# Patient Record
Sex: Male | Born: 1970 | Race: White | Hispanic: No | Marital: Single | Smoking: Never smoker
Health system: Southern US, Community
[De-identification: ages and names within clinical notes are randomized; demographics above are authoritative.]

## PROBLEM LIST (undated history)

## (undated) DIAGNOSIS — G40909 Epilepsy, unspecified, not intractable, without status epilepticus: Secondary | ICD-10-CM

## (undated) DIAGNOSIS — R42 Dizziness and giddiness: Secondary | ICD-10-CM

## (undated) HISTORY — PX: HERNIA REPAIR: SHX51

---

## 2013-07-19 DIAGNOSIS — R42 Dizziness and giddiness: Secondary | ICD-10-CM

## 2013-07-19 HISTORY — DX: Dizziness and giddiness: R42

## 2015-09-15 ENCOUNTER — Emergency Department
Admission: EM | Admit: 2015-09-15 | Discharge: 2015-09-15 | Disposition: A | Discharge status: Home / Self Care | Payer: BLUE CROSS/BLUE SHIELD | Attending: Emergency Medicine | Admitting: Emergency Medicine

## 2015-09-15 ENCOUNTER — Encounter: Payer: Self-pay | Admitting: Emergency Medicine

## 2015-09-15 DIAGNOSIS — T380X5A Adverse effect of glucocorticoids and synthetic analogues, initial encounter: Secondary | ICD-10-CM | POA: Insufficient documentation

## 2015-09-15 DIAGNOSIS — T783XXA Angioneurotic edema, initial encounter: Secondary | ICD-10-CM | POA: Diagnosis not present

## 2015-09-15 DIAGNOSIS — R220 Localized swelling, mass and lump, head: Secondary | ICD-10-CM | POA: Diagnosis present

## 2015-09-15 HISTORY — DX: Epilepsy, unspecified, not intractable, without status epilepticus: G40.909

## 2015-09-15 MED ORDER — FAMOTIDINE IN NACL 20-0.9 MG/50ML-% IV SOLN
20.0000 mg | Freq: Once | INTRAVENOUS | Status: AC
  Administered 2015-09-15: 20 mg via INTRAVENOUS
  Filled 2015-09-15: qty 50

## 2015-09-15 MED ORDER — SODIUM CHLORIDE 0.9 % IV BOLUS (SEPSIS)
1000.0000 mL | Freq: Once | INTRAVENOUS | Status: AC
  Administered 2015-09-15: 1000 mL via INTRAVENOUS

## 2015-09-15 MED ORDER — DIPHENHYDRAMINE HCL 50 MG/ML IJ SOLN
25.0000 mg | Freq: Once | INTRAMUSCULAR | Status: AC
  Administered 2015-09-15: 25 mg via INTRAVENOUS
  Filled 2015-09-15: qty 1

## 2015-09-15 NOTE — ED Provider Notes (Signed)
Patient reassessed at 9:15 AM, found to have no change in symptoms. Floor of mouth soft without any swelling or palatal elevation. Handling secretions, no difficulty swallowing, drinking fluids. Lungs clear to auscultation bilaterally with normal work of breathing. Swelling isolated to the lower lip only without any intraoral or throat involvement.  ----------------------------------------- 10:46 AM on 09/15/2015 ----------------------------------------- Patient stable, calm and comfortable. Lip swelling has diminished slightly. Otherwise no changes. Appears to be improving. With his subacute angioedema, likely related to prednisone use which is since been discontinued, I think this is going to continue improving. No evidence of any airway compromise at this time, we'll discharge home to follow up with primary care.   Sharman Cheek, MD 09/15/15 1047

## 2015-09-15 NOTE — ED Notes (Addendum)
Pt c/o oral swelling on bottom lip from taking prednisone Tuesday for URI. Pt has obvious swelling to bottom lip. Denies any itching or SOB from angioedema. Speech clear.PtA&O. VS stable

## 2015-09-15 NOTE — ED Notes (Signed)
Correction, pt states lip swelling occurred after prednisone was prescribed, pt was prescribed prednisone for URI symptoms.

## 2015-09-15 NOTE — ED Provider Notes (Signed)
[facility] Emergency Department Provider Note  ____________________________________________  Time seen: Approximately 623 AM  I have reviewed the triage vital signs and the nursing notes.   HISTORY  Chief Complaint Oral Swelling    HPI Jeremy Brooks is a 45 y.o. male who comes into the hospital today with lower lip swelling which started on Friday or Saturday. The patient reports that he has been taking prednisone since Tuesday. The patient reports that he completed his dose pack yesterday. He reports that the swelling started before he completed the pack but he was unsure what was going on. The patient reports that he took some Tylenol allergy and sinus and some naproxen but it did not help. The patient reports he has never had this happen before. He does not take any blood pressure medicines and has not been on any antibiotics. He was told it is upper respiratory infection for which she was taken the prednisone was a viral. The patient reports that he's had some shortness of breath but that was related to the upper respiratory infection. He's had no throat scratching or throat swelling. The patient was concerned tonight so he decided to come in and get checked out.   Past Medical History  Diagnosis Date  . Epilepsy (HCC)     There are no active problems to display for this patient.   History reviewed. No pertinent past surgical history.  No current outpatient prescriptions on file.  Allergies Review of patient's allergies indicates no known allergies.  History reviewed. No pertinent family history.  Social History Social History  Substance Use Topics  . Smoking status: Never Smoker   . Smokeless tobacco: Never Used  . Alcohol Use: No    Review of Systems Constitutional: No fever/chills Eyes: No visual changes. ENT: Lower lip swelling Cardiovascular: Denies chest pain. Respiratory: Denies shortness of breath. Gastrointestinal: No abdominal  pain.  No nausea, no vomiting.  No diarrhea.  No constipation. Genitourinary: Negative for dysuria. Musculoskeletal: Negative for back pain. Skin: Negative for rash. Neurological: Negative for headaches, focal weakness or numbness.  10-point ROS otherwise negative.  ____________________________________________   PHYSICAL EXAM:  VITAL SIGNS: ED Triage Vitals  Enc Vitals Group     BP 09/15/15 0608 110/46 mmHg     Pulse Rate 09/15/15 0608 130     Resp 09/15/15 0608 22     Temp 09/15/15 0608 98.8 F (37.1 C)     Temp Source 09/15/15 0608 Oral     SpO2 09/15/15 0608 94 %     Weight 09/15/15 0608 240 lb (108.863 kg)     Height 09/15/15 0608  (1.727 m)     Head Cir --      Peak Flow --      Pain Score 09/15/15 0609 2     Pain Loc --      Pain Edu? --      Excl. in GC? --     Constitutional: Alert and oriented. Well appearing and in mild distress. Eyes: Conjunctivae are normal. PERRL. EOMI. Head: Atraumatic. Nose: No congestion/rhinnorhea. Mouth/Throat: Lower lip swelling with no tongue swelling or posterior oropharynx swelling Mucous membranes are moist.  Oropharynx non-erythematous. Neck: No stridor.  Cardiovascular: Normal rate, regular rhythm. Grossly normal heart sounds.  Good peripheral circulation. Respiratory: Normal respiratory effort.  No retractions. Lungs CTAB. Gastrointestinal: Soft and nontender. No distention. Positive bowel sounds Musculoskeletal: No lower extremity tenderness nor edema.   Neurologic:  Normal speech and language.  Skin:  Skin is warm, dry and intact.  Psychiatric: Mood and affect are normal.   ____________________________________________   LABS (all labs ordered are listed, but only abnormal results are displayed)  Labs Reviewed - No data to  display ____________________________________________  EKG  None ____________________________________________  RADIOLOGY  None ____________________________________________   PROCEDURES  Procedure(s) performed: None  Critical Care performed: No  ____________________________________________   INITIAL IMPRESSION / ASSESSMENT AND PLAN / ED COURSE  Pertinent labs & imaging results that were available during my care of the patient were reviewed by me and considered in my medical decision making (see chart for details).  This is a 45 year old male who comes into the hospital today with lower lip swelling. The patient appears to have angioedema but he does not take ACE inhibitor's. The patient was only taking prednisone when this started. I will give the patient a dose of Benadryl as well as a dose of Pepcid and a liter of normal saline. The patient's care was signed out to Dr. Alfonse Flavors who will follow-up the patient and reassess his lip swelling. If it is improving he will be discharged home with some Pepcid and Benadryl.  ____________________________________________   FINAL CLINICAL IMPRESSION(S) / ED DIAGNOSES  Final diagnoses:  Angioedema, initial encounter      Rebecka Apley, MD 09/15/15 [phone]

## 2015-09-15 NOTE — Discharge Instructions (Signed)
Angioedema  Angioedema is a sudden swelling of tissues, often of the skin. It can occur on the face or genitals or in the abdomen or other body parts. The swelling usually develops over a short period and gets better in 24 to 48 hours. It often begins during the night and is found when the person wakes up. The person may also get red, itchy patches of skin (hives). Angioedema can be dangerous if it involves swelling of the air passages.   Depending on the cause, episodes of angioedema may only happen once, come back in unpredictable patterns, or repeat for several years and then gradually fade away.   CAUSES   Angioedema can be caused by an allergic reaction to various triggers. It can also result from nonallergic causes, including reactions to drugs, immune system disorders, viral infections, or an abnormal gene that is passed to you from your parents (hereditary). For some people with angioedema, the cause is unknown.   Some things that can trigger angioedema include:    Foods.    Medicines, such as ACE inhibitors, ARBs, nonsteroidal anti-inflammatory agents, or estrogen.    Latex.    Animal saliva.    Insect stings.    Dyes used in X-rays.    Mild injury.    Dental work.   Surgery.   Stress.    Sudden changes in temperature.    Exercise.  SIGNS AND SYMPTOMS    Swelling of the skin.   Hives. If these are present, there is also intense itching.   Redness in the affected area.    Pain in the affected area.   Swollen lips or tongue.   Breathing problems. This may happen if the air passages swell.   Wheezing.  If internal organs are involved, there may be:    Nausea.    Abdominal pain.    Vomiting.    Difficulty swallowing.    Difficulty passing urine.  DIAGNOSIS    Your health care provider will examine the affected area and take a medical and family history.   Various tests may be done to help determine the cause. Tests may include:   Allergy skin tests to see if the problem  is an allergic reaction.    Blood tests to check for hereditary angioedema.    Tests to check for underlying diseases that could cause the condition.    A review of your medicines, including over-the-counter medicines, may be done.  TREATMENT   Treatment will depend on the cause of the angioedema. Possible treatments include:    Removal of anything that triggered the condition (such as stopping certain medicines).    Medicines to treat symptoms or prevent attacks. Medicines given may include:     Antihistamines.     Epinephrine injection.     Steroids.    Hospitalization may be required for severe attacks. If the air passages are affected, it can be an emergency. Tubes may need to be placed to keep the airway open.  HOME CARE INSTRUCTIONS    Take all medicines as directed by your health care provider.   If you were given medicines for emergency allergy treatment, always carry them with you.   Wear a medical bracelet as directed by your health care provider.    Avoid known triggers.  SEEK MEDICAL CARE IF:    You have repeat attacks of angioedema.    Your attacks are more frequent or more severe despite preventive measures.    You have hereditary angioedema   and are considering having children. It is important to discuss with your health care provider the risks of passing the condition on to your children.  SEEK IMMEDIATE MEDICAL CARE IF:    You have severe swelling of the mouth, tongue, or lips.   You have difficulty breathing.    You have difficulty swallowing.    You faint.  MAKE SURE YOU:   Understand these instructions.   Will watch your condition.   Will get help right away if you are not doing well or get worse.     This information is not intended to replace advice given to you by your health care provider. Make sure you discuss any questions you have with your health care provider.     Document Released: 09/13/2001 Document Revised: 07/26/2014 Document Reviewed:  02/26/2013  Elsevier Interactive Patient Education 2016 [employer]

## 2015-09-15 NOTE — ED Notes (Signed)
Pt states began to experience lower lip swelling two days pta. Pt states was seen at urgent care and prescribed prednisone for swelling. Pt states lip has gotten more swollen since starting steroids. Pt with significant lower lip swelling, no hives noted, pt complains of shob, denies nausea, vomiting. Pt able to speak in full sentences. Denies fever.

## 2015-09-16 ENCOUNTER — Emergency Department
Admission: EM | Admit: 2015-09-16 | Discharge: 2015-09-16 | Disposition: A | Discharge status: Home / Self Care | Payer: BLUE CROSS/BLUE SHIELD | Attending: Emergency Medicine | Admitting: Emergency Medicine

## 2015-09-16 ENCOUNTER — Encounter: Payer: Self-pay | Admitting: Emergency Medicine

## 2015-09-16 DIAGNOSIS — S00501A Unspecified superficial injury of lip, initial encounter: Secondary | ICD-10-CM | POA: Insufficient documentation

## 2015-09-16 DIAGNOSIS — Y9389 Activity, other specified: Secondary | ICD-10-CM | POA: Diagnosis not present

## 2015-09-16 DIAGNOSIS — L089 Local infection of the skin and subcutaneous tissue, unspecified: Secondary | ICD-10-CM

## 2015-09-16 DIAGNOSIS — X58XXXA Exposure to other specified factors, initial encounter: Secondary | ICD-10-CM | POA: Insufficient documentation

## 2015-09-16 DIAGNOSIS — R000 Tachycardia, unspecified: Secondary | ICD-10-CM | POA: Diagnosis not present

## 2015-09-16 DIAGNOSIS — R220 Localized swelling, mass and lump, head: Secondary | ICD-10-CM | POA: Diagnosis present

## 2015-09-16 DIAGNOSIS — Y998 Other external cause status: Secondary | ICD-10-CM | POA: Insufficient documentation

## 2015-09-16 DIAGNOSIS — Y9289 Other specified places as the place of occurrence of the external cause: Secondary | ICD-10-CM | POA: Diagnosis not present

## 2015-09-16 LAB — CBC
HCT: 44.1 % (ref 40.0–52.0)
Hemoglobin: 15.1 g/dL (ref 13.0–18.0)
MCH: 31.3 pg (ref 26.0–34.0)
MCHC: 34.2 g/dL (ref 32.0–36.0)
MCV: 91.6 fL (ref 80.0–100.0)
PLATELETS: 173 10*3/uL (ref 150–440)
RBC: 4.81 MIL/uL (ref 4.40–5.90)
RDW: 12.3 % (ref 11.5–14.5)
WBC: 13.3 10*3/uL — ABNORMAL HIGH (ref 3.8–10.6)

## 2015-09-16 LAB — COMPREHENSIVE METABOLIC PANEL
ALBUMIN: 3.5 g/dL (ref 3.5–5.0)
ALT: 22 U/L (ref 17–63)
ANION GAP: 14 (ref 5–15)
AST: 22 U/L (ref 15–41)
Alkaline Phosphatase: 64 U/L (ref 38–126)
BUN: 12 mg/dL (ref 6–20)
CALCIUM: 8.9 mg/dL (ref 8.9–10.3)
CO2: 24 mmol/L (ref 22–32)
Chloride: 97 mmol/L — ABNORMAL LOW (ref 101–111)
Creatinine, Ser: 0.83 mg/dL (ref 0.61–1.24)
GFR calc non Af Amer: >60 mL/min (ref >60)
GLUCOSE: 264 mg/dL — AB (ref 65–99)
POTASSIUM: 4 mmol/L (ref 3.5–5.1)
SODIUM: 135 mmol/L (ref 135–145)
TOTAL PROTEIN: 8 g/dL (ref 6.5–8.1)
Total Bilirubin: 0.9 mg/dL (ref 0.3–1.2)

## 2015-09-16 MED ORDER — SODIUM CHLORIDE 0.9 % IV SOLN
1000.0000 mL | Freq: Once | INTRAVENOUS | Status: AC
  Administered 2015-09-16: 1000 mL via INTRAVENOUS

## 2015-09-16 MED ORDER — CLINDAMYCIN HCL 300 MG PO CAPS: 300.0000 mg | ORAL_CAPSULE | Freq: Three times a day (TID) | ORAL | Status: DC

## 2015-09-16 MED ORDER — PREDNISONE 10 MG PO TABS: 10.0000 mg | ORAL_TABLET | Freq: Every day | ORAL | Status: DC

## 2015-09-16 MED ORDER — CLINDAMYCIN PHOSPHATE 600 MG/50ML IV SOLN
600.0000 mg | Freq: Once | INTRAVENOUS | Status: AC
  Administered 2015-09-16: 600 mg via INTRAVENOUS
  Filled 2015-09-16: qty 50

## 2015-09-16 NOTE — ED Provider Notes (Signed)
[facility] Emergency Department Provider Note  ____________________________________________    I have reviewed the triage vital signs and the nursing notes.   HISTORY  Chief Complaint Lip swelling   HPI Jeremy Brooks is a 45 y.o. male who presents with lip swelling and discomfort. Patient was seen yesterday for swelling to the lower lip which was attributed to angioedema. Today [facility] and was sent to the ED for increased redness and pain. There is a suspicion for infection. Patient denies fevers. No intraoral symptoms. No neck pain.     Past Medical History  Diagnosis Date  . Epilepsy (HCC)     There are no active problems to display for this patient.   History reviewed. No pertinent past surgical history.  Current Outpatient Rx  Name  Route  Sig  Dispense  Refill  . clindamycin (CLEOCIN) 300 MG capsule   Oral   Take 1 capsule (300 mg total) by mouth 3 (three) times daily.   30 capsule   0   . predniSONE (DELTASONE) 10 MG tablet   Oral   Take 1 tablet (10 mg total) by mouth daily.   5 tablet   0     Allergies Review of patient's allergies indicates no known allergies.  No family history on file.  Social History Social History  Substance Use Topics  . Smoking status: Never Smoker   . Smokeless tobacco: Never Used  . Alcohol Use: No    Review of Systems  Constitutional: Negative for fever. Eyes: Negative for visual changes. ENT: Negative for sore throat Cardiovascular: Negative for chest pain. Respiratory: Negative for cough Gastrointestinal: Negative for abdominal pain, vomiting Musculoskeletal: Negative for neck pain Skin: Negative for rash. Neurological: Negative for headaches Psychiatric no anxiety    ____________________________________________   PHYSICAL EXAM:  VITAL SIGNS: ED Triage Vitals  Enc Vitals Group     BP 09/16/15 1523 136/93 mmHg     Pulse Rate 09/16/15 1523 122    Resp 09/16/15 1523 18     Temp 09/16/15 1523 99.2 F (37.3 C)     Temp Source 09/16/15 1523 Oral     SpO2 09/16/15 1523 97 %     Weight 09/16/15 1523 243 lb (110.224 kg)     Height 09/16/15 1523  (1.727 m)     Head Cir --      Peak Flow --      Pain Score 09/16/15 1640 2     Pain Loc --      Pain Edu? --      Excl. in GC? --      Constitutional: Alert and oriented. Well appearing and in no distress. Eyes: Conjunctivae are normal.  ENT   Head: Normocephalic and atraumatic.   Mouth/Throat: Mucous membranes are moist. Lower lip is uniformly swollen and erythematous. No fluctuance or discharge noted but there is a concern for infection Cardiovascular: Tachycardia, regular rhythm.  Respiratory: Normal respiratory effort without tachypnea nor retractions.   Genitourinary: deferred Musculoskeletal: Nontender with normal range of motion in all extremities. Neurologic:  Normal speech and language. No gross focal neurologic deficits are appreciated. Skin:  Skin is warm, dry and intact. No rash noted. Psychiatric: Mood and affect are normal. Patient exhibits appropriate insight and judgment.  ____________________________________________    LABS (pertinent positives/negatives)  Labs Reviewed  CBC  COMPREHENSIVE METABOLIC PANEL    ____________________________________________   EKG  None  ____________________________________________    RADIOLOGY I  have personally reviewed any xrays that were ordered on this patient: None  ____________________________________________   PROCEDURES  Procedure(s) performed: none  Critical Care performed: none  ____________________________________________   INITIAL IMPRESSION / ASSESSMENT AND PLAN / ED COURSE  Pertinent labs & imaging results that were available during my care of the patient were reviewed by me and considered in my medical decision making (see chart for details).  Suspicious for infection of the lip.  Discussed with Dr. Jenne Campus of ENT who recommends by mouth clindamycin and he will see the patient in his office.  Patient's heart rate continues to be elevated. We will give fluids and check labs. Mild elevation in white blood cell count noted.  Patient's heart rate improved with fluids we gave him a dose of IV clindamycin here. He will go home with by mouth clindamycin. Patient is very well-appearing and nontoxic. He is afebrile, I did discuss return precautions with the patient  ____________________________________________   FINAL CLINICAL IMPRESSION(S) / ED DIAGNOSES  Final diagnoses:  Superficial injury of lip with infection, initial encounter     Jene Every, MD 09/16/15 [phone]

## 2015-09-16 NOTE — ED Notes (Signed)
Pt states he was here yesterday for lower lip swelling, treated for angioedema. Pt brought over from [facility] with concern of lip infection. Pt has open sores to lower lip due to lip cracking from swelling, [facility] believes this is the infection site. Pt unsure of initial reason for swelling.

## 2015-09-16 NOTE — ED Notes (Signed)
Was seen yesterday for swelling to lower lip   States his lower lip swelling is worse today

## 2015-09-16 NOTE — Discharge Instructions (Signed)

## 2015-09-29 ENCOUNTER — Other Ambulatory Visit: Payer: Self-pay | Admitting: Unknown Physician Specialty

## 2015-09-29 DIAGNOSIS — J3489 Other specified disorders of nose and nasal sinuses: Secondary | ICD-10-CM

## 2015-10-01 ENCOUNTER — Ambulatory Visit
Admission: RE | Admit: 2015-10-01 | Discharge: 2015-10-01 | Disposition: A | Discharge status: Home / Self Care | Payer: BLUE CROSS/BLUE SHIELD | Source: Ambulatory Visit | Attending: Unknown Physician Specialty | Admitting: Unknown Physician Specialty

## 2015-10-01 DIAGNOSIS — J3489 Other specified disorders of nose and nasal sinuses: Secondary | ICD-10-CM | POA: Diagnosis present

## 2015-10-01 MED ORDER — IOHEXOL 300 MG/ML  SOLN
75.0000 mL | Freq: Once | INTRAMUSCULAR | Status: AC | PRN
  Administered 2015-10-01: 75 mL via INTRAVENOUS

## 2015-10-08 ENCOUNTER — Encounter: Payer: Self-pay | Admitting: *Deleted

## 2015-10-10 ENCOUNTER — Encounter
Admission: RE | Disposition: A | Discharge status: Home / Self Care | Payer: Self-pay | Source: Ambulatory Visit | Attending: Unknown Physician Specialty

## 2015-10-10 ENCOUNTER — Ambulatory Visit
Admission: RE | Admit: 2015-10-10 | Discharge: 2015-10-10 | Disposition: A | Discharge status: Home / Self Care | Payer: BLUE CROSS/BLUE SHIELD | Source: Ambulatory Visit | Attending: Unknown Physician Specialty | Admitting: Unknown Physician Specialty

## 2015-10-10 ENCOUNTER — Ambulatory Visit: Payer: BLUE CROSS/BLUE SHIELD | Admitting: Anesthesiology

## 2015-10-10 DIAGNOSIS — Z833 Family history of diabetes mellitus: Secondary | ICD-10-CM | POA: Insufficient documentation

## 2015-10-10 DIAGNOSIS — J340 Abscess, furuncle and carbuncle of nose: Secondary | ICD-10-CM | POA: Insufficient documentation

## 2015-10-10 DIAGNOSIS — B958 Unspecified staphylococcus as the cause of diseases classified elsewhere: Secondary | ICD-10-CM | POA: Insufficient documentation

## 2015-10-10 DIAGNOSIS — L0291 Cutaneous abscess, unspecified: Secondary | ICD-10-CM | POA: Diagnosis present

## 2015-10-10 DIAGNOSIS — Z8249 Family history of ischemic heart disease and other diseases of the circulatory system: Secondary | ICD-10-CM | POA: Diagnosis not present

## 2015-10-10 HISTORY — DX: Dizziness and giddiness: R42

## 2015-10-10 HISTORY — PX: INCISION AND DRAINAGE ABSCESS: SHX5864

## 2015-10-10 SURGERY — INCISION AND DRAINAGE, ABSCESS
Anesthesia: General | Site: Nose | Laterality: Bilateral | Wound class: Clean Contaminated

## 2015-10-10 MED ORDER — LIDOCAINE HCL (CARDIAC) 20 MG/ML IV SOLN
INTRAVENOUS | Status: DC | PRN
  Administered 2015-10-10: 50 mg via INTRAVENOUS

## 2015-10-10 MED ORDER — LACTATED RINGERS IV SOLN
INTRAVENOUS | Status: DC
  Administered 2015-10-10: 10:00:00 via INTRAVENOUS

## 2015-10-10 MED ORDER — ACETAMINOPHEN 325 MG PO TABS: 325.0000 mg | ORAL_TABLET | ORAL | Status: DC | PRN

## 2015-10-10 MED ORDER — ACETAMINOPHEN 160 MG/5ML PO SOLN: 325.0000 mg | ORAL | Status: DC | PRN

## 2015-10-10 MED ORDER — SULFAMETHOXAZOLE-TRIMETHOPRIM 400-80 MG PO TABS: 1.0000 | ORAL_TABLET | Freq: Two times a day (BID) | ORAL | Status: AC

## 2015-10-10 MED ORDER — ONDANSETRON HCL 4 MG/2ML IJ SOLN
INTRAMUSCULAR | Status: DC | PRN
  Administered 2015-10-10: 4 mg via INTRAVENOUS

## 2015-10-10 MED ORDER — PHENYLEPHRINE HCL 0.5 % NA SOLN
NASAL | Status: DC | PRN
  Administered 2015-10-10: 30 mL via TOPICAL

## 2015-10-10 MED ORDER — OXYMETAZOLINE HCL 0.05 % NA SOLN
6.0000 | Freq: Once | NASAL | Status: AC
  Administered 2015-10-10: 6 via NASAL

## 2015-10-10 MED ORDER — OXYCODONE HCL 5 MG PO TABS: 5.0000 mg | ORAL_TABLET | Freq: Once | ORAL | Status: DC | PRN

## 2015-10-10 MED ORDER — PROPOFOL 10 MG/ML IV BOLUS
INTRAVENOUS | Status: DC | PRN
  Administered 2015-10-10: 200 mg via INTRAVENOUS

## 2015-10-10 MED ORDER — FENTANYL CITRATE (PF) 100 MCG/2ML IJ SOLN
INTRAMUSCULAR | Status: DC | PRN
  Administered 2015-10-10: 100 ug via INTRAVENOUS

## 2015-10-10 MED ORDER — GLYCOPYRROLATE 0.2 MG/ML IJ SOLN
INTRAMUSCULAR | Status: DC | PRN
  Administered 2015-10-10: 0.2 mg via INTRAVENOUS

## 2015-10-10 MED ORDER — SUCCINYLCHOLINE CHLORIDE 20 MG/ML IJ SOLN
INTRAMUSCULAR | Status: DC | PRN
  Administered 2015-10-10: 100 mg via INTRAVENOUS

## 2015-10-10 MED ORDER — OXYCODONE HCL 5 MG/5ML PO SOLN: 5.0000 mg | Freq: Once | ORAL | Status: DC | PRN

## 2015-10-10 MED ORDER — LIDOCAINE-EPINEPHRINE 1 %-1:100000 IJ SOLN
INTRAMUSCULAR | Status: DC | PRN
  Administered 2015-10-10: 1 mL

## 2015-10-10 MED ORDER — DEXAMETHASONE SODIUM PHOSPHATE 4 MG/ML IJ SOLN: 8.0000 mg | Freq: Once | INTRAMUSCULAR | Status: DC | PRN

## 2015-10-10 MED ORDER — FENTANYL CITRATE (PF) 100 MCG/2ML IJ SOLN: 25.0000 ug | INTRAMUSCULAR | Status: DC | PRN

## 2015-10-10 MED ORDER — LACTATED RINGERS IV SOLN: 500.0000 mL | INTRAVENOUS | Status: DC

## 2015-10-10 MED ORDER — MIDAZOLAM HCL 5 MG/5ML IJ SOLN
INTRAMUSCULAR | Status: DC | PRN
  Administered 2015-10-10: 2 mg via INTRAVENOUS

## 2015-10-10 SURGICAL SUPPLY — 11 items
BLADE MYR LANCE NRW W/HDL (BLADE) IMPLANT
CANISTER SUCT 1200ML W/VALVE (MISCELLANEOUS) ×3 IMPLANT
COTTONBALL LRG STERILE PKG (GAUZE/BANDAGES/DRESSINGS) IMPLANT
GLOVE BIO SURGEON STRL SZ7.5 (GLOVE) ×3 IMPLANT
KIT ROOM TURNOVER OR (KITS) ×3 IMPLANT
NS IRRIG 500ML POUR BTL (IV SOLUTION) ×3 IMPLANT
SPONGE NEURO XRAY DETECT 1X3 (DISPOSABLE) ×3 IMPLANT
STRAP BODY AND KNEE 60X3 (MISCELLANEOUS) ×3 IMPLANT
TOWEL OR 17X26 4PK STRL BLUE (TOWEL DISPOSABLE) ×3 IMPLANT
TUBING CONN 6MMX3.1M (TUBING) ×2
TUBING SUCTION CONN 0.25 STRL (TUBING) ×1 IMPLANT

## 2015-10-10 NOTE — Anesthesia Postprocedure Evaluation (Signed)
Anesthesia Post Note  Patient: Jeremy GrahamRonald N Brooks  Procedure(s) Performed: Procedure(s) (LRB): INCISION AND DRAINAGE ABSCESS NASAL SEPTAL HEMATOMA (Bilateral)  Patient location during evaluation: PACU Anesthesia Type: General Level of consciousness: awake and alert Pain management: pain level controlled Vital Signs Assessment: post-procedure vital signs reviewed and stable Respiratory status: spontaneous breathing, nonlabored ventilation and respiratory function stable Cardiovascular status: blood pressure returned to baseline and stable Postop Assessment: no signs of nausea or vomiting Anesthetic complications: no    Lennyn Bellanca D Rainn Zupko

## 2015-10-10 NOTE — Anesthesia Procedure Notes (Signed)
Procedure Name: Intubation Date/Time: 10/10/2015 11:09 AM Performed by: Andee PolesBUSH, Danial Hlavac Pre-anesthesia Checklist: Patient identified, Emergency Drugs available, Suction available, Patient being monitored and Timeout performed Patient Re-evaluated:Patient Re-evaluated prior to inductionOxygen Delivery Method: Circle system utilized Preoxygenation: Pre-oxygenation with 100% oxygen Intubation Type: IV induction Ventilation: Mask ventilation without difficulty and Oral airway inserted - appropriate to patient size Laryngoscope Size: Glidescope and 4 Grade View: Grade I Tube type: Oral Rae Tube size: 7.5 mm Number of attempts: 1 Placement Confirmation: ETT inserted through vocal cords under direct vision,  positive ETCO2 and breath sounds checked- equal and bilateral Tube secured with: Tape Dental Injury: Teeth and Oropharynx as per pre-operative assessment  Difficulty Due To: Difficulty was anticipated, Difficult Airway- due to large tongue, Difficult Airway- due to anterior larynx and Difficult Airway- due to limited oral opening

## 2015-10-10 NOTE — H&P (Signed)
  H+P  Reviewed and will be scanned in later. No changes noted. 

## 2015-10-10 NOTE — Op Note (Signed)
10/10/2015  11:27 AM    Conrad BurlingtonMoore, Yama  [id number]   Pre-Op Dx: NASAL SEPTAL HEMATOMA  Post-op Dx: SAME  Proc: Incision and drainage of septal hematoma   Surg:  Linus SalmonsMCQUEEN,Linette Gunderson T  Anes:  GOT  EBL:  Less than 10 cc  Comp:  None  Findings:  Approximately 3 cc of serosanguineous fluid small anterior septal pocket  Procedure: Ron was identified in the holding area and taken to the operating room and placed in supine position. After general trach anesthesia a topical anesthetic of phenylephrine lidocaine was placed within each nostril on cotton pledgets. After 5 minutes this was removed. Examination the septum showed a soft spongy anterior collection of fluid consistent with what had been seen on CT scan. A local anesthetic of 1% lidocaine with 1 100,000 insulin epinephrine was used to inject the mucosa on the left-hand side. A 15 blade was used to open this pocket immediately approximately 3 cc of serosanguineous fluid was admitted. This was sent for culture. With the pocket drained it was copiously irrigated with saline a small biopsy was taken of the soft tissue as well as a small area of cartilage biopsied for specimen. With the collection drained and cultured a 5-0 chromic was used in a whipstitch fashion to reapproximate the mucosal surfaces. This incision was left open internally to prevent further fluid collection. This completed the patient was returned to anesthesia where he was extubated in the operating room and taken recovery room in stable condition  Cultures: Septal fluid collection  Specimens: Septal pocket tissue  Estimated blood loss less than 10 cc  Dispo:   Good  Plan:  Follow-up 1 week  Ariel Dimitri T  10/10/2015 11:27 AM

## 2015-10-10 NOTE — Discharge Instructions (Signed)
[facility] °[facility] °ENDOSCOPIC SINUS SURGERY °[facility] ° °What is Functional Endoscopic Sinus Surgery? ° The Surgery involves making the natural openings of the sinuses larger by removing the bony partitions that separate the sinuses from the nasal cavity.  The natural sinus lining is preserved as much as possible to allow the sinuses to resume normal function after the surgery.  In some patients nasal polyps (excessively swollen lining of the sinuses) may be removed to relieve obstruction of the sinus openings.  The surgery is performed through the nose using lighted scopes, which eliminates the need for incisions on the face.  A septoplasty is a different procedure which is sometimes performed with sinus surgery.  It involves straightening the boy partition that separates the two sides of your nose.  A crooked or deviated septum may need repair if is obstructing the sinuses or nasal airflow.  Turbinate reduction is also often performed during sinus surgery.  The turbinates are bony proturberances from the side walls of the nose which swell and can obstruct the nose in patients with sinus and allergy problems.  Their size can be surgically reduced to help relieve nasal obstruction. ° °What Can Sinus Surgery Do For Me? ° Sinus surgery can reduce the frequency of sinus infections requiring antibiotic treatment.  This can provide improvement in nasal congestion, post-nasal drainage, facial pressure and nasal obstruction.  Surgery will NOT prevent you from ever having an infection again, so it usually only for patients who get infections 4 or more times yearly requiring antibiotics, or for infections that do not clear with antibiotics.  It will not cure nasal allergies, so patients with allergies may still require medication to treat their allergies after surgery. Surgery may improve headaches related to sinusitis, however, some people will continue to  require medication to control sinus headaches related to allergies.  Surgery will do nothing for other forms of headache (migraine, tension or cluster). ° °What Are the Risks of Endoscopic Sinus Surgery? ° Current techniques allow surgery to be performed safely with little risk, however, there are rare complications that patients should be aware of.  Because the sinuses are located around the eyes, there is risk of eye injury, including blindness, though again, this would be quite rare. This is usually a result of bleeding behind the eye during surgery, which puts the vision oat risk, though there are treatments to protect the vision and prevent permanent disrupted by surgery causing a leak of the spinal fluid that surrounds the brain.  More serious complications would include bleeding inside the brain cavity or damage to the brain.  Again, all of these complications are uncommon, and spinal fluid leaks can be safely managed surgically if they occur.  The most common complication of sinus surgery is bleeding from the nose, which may require packing or cauterization of the nose.  Continued sinus have polyps may experience recurrence of the polyps requiring revision surgery.  Alterations of sense of smell or injury to the tear ducts are also rare complications.  ° °What is the Surgery Like, and what is the Recovery? ° The Surgery usually takes a couple of hours to perform, and is usually performed under a general anesthetic (completely asleep).  Patients are usually discharged home after a couple of hours.  Sometimes during surgery it is necessary to pack the nose to control bleeding, and the packing is left in place for 24 - 48 hours, and removed by your surgeon.    If a septoplasty was performed during the procedure, there is often a splint placed which must be removed after 5-7 days.   °Discomfort: Pain is usually mild to moderate, and can be controlled by prescription pain medication or acetaminophen (Tylenol).   Aspirin, Ibuprofen (Advil, Motrin), or Naprosyn (Aleve) should be avoided, as they can cause increased bleeding.  Most patients feel sinus pressure like they have a bad head cold for several days.  Sleeping with your head elevated can help reduce swelling and facial pressure, as can ice packs over the face.  A humidifier may be helpful to keep the mucous and blood from drying in the nose.  ° °Diet: There are no specific diet restrictions, however, you should generally start with clear liquids and a light diet of bland foods because the anesthetic can cause some nausea.  Advance your diet depending on how your stomach feels.  Taking your pain medication with food will often help reduce stomach upset which pain medications can cause. ° °Nasal Saline Irrigation: It is important to remove blood clots and dried mucous from the nose as it is healing.  This is done by having you irrigate the nose at least 3 - 4 times daily with a salt water solution.  We recommend using NeilMed Sinus Rinse (available at the drug store).  Fill the squeeze bottle with the solution, bend over a sink, and insert the tip of the squeeze bottle into the nose ½ of an inch.  Point the tip of the squeeze bottle towards the inside corner of the eye on the same side your irrigating.  Squeeze the bottle and gently irrigate the nose.  If you bend forward as you do this, most of the fluid will flow back out of the nose, instead of down your throat.   The solution should be warm, near body temperature, when you irrigate.   Each time you irrigate, you should use a full squeeze bottle.  ° °Note that if you are instructed to use Nasal Steroid Sprays at any time after your surgery, irrigate with saline BEFORE using the steroid spray, so you do not wash it all out of the nose. °Another product, Nasal Saline Gel (such as AYR Nasal Saline Gel) can be applied in each nostril 3 - 4 times daily to moisture the nose and reduce scabbing or crusting. ° °Bleeding:   Bloody drainage from the nose can be expected for several days, and patients are instructed to irrigate their nose frequently with salt water to help remove mucous and blood clots.  The drainage may be dark red or brown, though some fresh blood may be seen intermittently, especially after irrigation.  Do not blow you nose, as bleeding may occur. If you must sneeze, keep your mouth open to allow air to escape through your mouth. ° °If heavy bleeding occurs: Irrigate the nose with saline to rinse out clots, then spray the nose 3 - 4 times with Afrin Nasal Decongestant Spray.  The spray will constrict the blood vessels to slow bleeding.  Pinch the lower half of your nose shut to apply pressure, and lay down with your head elevated.  Ice packs over the nose may help as well. If bleeding persists despite these measures, you should notify your doctor.  Do not use the Afrin routinely to control nasal congestion after surgery, as it can result in worsening congestion and may affect healing.  ° ° ° °Activity: Return to work varies among patients. Most patients will be   out of work at least 5 - 7 days to recover.  Patient may return to work after they are off of narcotic pain medication, and feeling well enough to perform the functions of their job.  Patients must avoid heavy lifting (over 10 pounds) or strenuous physical for 2 weeks after surgery, so your employer may need to assign you to light duty, or keep you out of work longer if light duty is not possible.  NOTE: you should not drive, operate dangerous machinery, do any mentally demanding tasks or make any important legal or financial decisions while on narcotic pain medication and recovering from the general anesthetic.  °  °Call Your Doctor Immediately if You Have Any of the Following: °1. Bleeding that you cannot control with the above measures °2. Loss of vision, double vision, bulging of the eye or black eyes. °3. Fever over 101 degrees °4. Neck stiffness with  severe headache, fever, nausea and change in mental state. °You are always encourage to call anytime with concerns, however, please call with requests for pain medication refills during office hours. ° °Office Endoscopy: During follow-up visits your doctor will remove any packing or splints that may have been placed and evaluate and clean your sinuses endoscopically.  Topical anesthetic will be used to make this as comfortable as possible, though you may want to take your pain medication prior to the visit.  How often this will need to be done varies from patient to patient.  After complete recovery from the surgery, you may need follow-up endoscopy from time to time, particularly if there is concern of recurrent infection or nasal polyps. ° °General Anesthesia, Adult, Care After °Refer to this sheet in the next few weeks. These instructions provide you with information on caring for yourself after your procedure. Your health care provider may also give you more specific instructions. Your treatment has been planned according to current medical practices, but problems sometimes occur. Call your health care provider if you have any problems or questions after your procedure. °WHAT TO EXPECT AFTER THE PROCEDURE °After the procedure, it is typical to experience: °· Sleepiness. °· Nausea and vomiting. °HOME CARE INSTRUCTIONS °· For the first 24 hours after general anesthesia: °¨ Have a responsible person with you. °¨ Do not drive a car. If you are alone, do not take public transportation. °¨ Do not drink alcohol. °¨ Do not take medicine that has not been prescribed by your health care provider. °¨ Do not sign important papers or make important decisions. °¨ You may resume a normal diet and activities as directed by your health care provider. °· Change bandages (dressings) as directed. °· If you have questions or problems that seem related to general anesthesia, call the hospital and ask for the anesthetist or  anesthesiologist on call. °SEEK MEDICAL CARE IF: °· You have nausea and vomiting that continue the day after anesthesia. °· You develop a rash. °SEEK IMMEDIATE MEDICAL CARE IF:  °· You have difficulty breathing. °· You have chest pain. °· You have any allergic problems. °  °This information is not intended to replace advice given to you by your health care provider. Make sure you discuss any questions you have with your health care provider. °  °Document Released: 10/11/2000 Document Revised: 07/26/2014 Document Reviewed: 11/03/2011 °Elsevier Interactive Patient Education ©2016 [employer] ° °

## 2015-10-10 NOTE — Anesthesia Preprocedure Evaluation (Addendum)
Anesthesia Evaluation  Patient identified by MRN, date of birth, ID band Patient awake    Reviewed: Allergy & Precautions, H&P , NPO status , Patient's Chart, lab work & pertinent test results, reviewed documented beta blocker date and time   Airway Mallampati: III  TM Distance: <3 FB Neck ROM: full  Mouth opening: Limited Mouth Opening  Dental no notable dental hx.    Pulmonary neg pulmonary ROS,    Pulmonary exam normal breath sounds clear to auscultation       Cardiovascular Exercise Tolerance: Good negative cardio ROS   Rhythm:regular Rate:Normal     Neuro/Psych negative neurological ROS  negative psych ROS   GI/Hepatic negative GI ROS, Neg liver ROS,   Endo/Other  negative endocrine ROS  Renal/GU negative Renal ROS  negative genitourinary   Musculoskeletal   Abdominal   Peds  Hematology negative hematology ROS (+)   Anesthesia Other Findings   Reproductive/Obstetrics negative OB ROS                            Anesthesia Physical Anesthesia Plan  ASA: I  Anesthesia Plan: General   Post-op Pain Management:    Induction:   Airway Management Planned:   Additional Equipment:   Intra-op Plan:   Post-operative Plan:   Informed Consent: I have reviewed the patients History and Physical, chart, labs and discussed the procedure including the risks, benefits and alternatives for the proposed anesthesia with the patient or authorized representative who has indicated his/her understanding and acceptance.     Plan Discussed with: CRNA  Anesthesia Plan Comments:         Anesthesia Quick Evaluation

## 2015-10-10 NOTE — Transfer of Care (Signed)
Immediate Anesthesia Transfer of Care Note  Patient: Jeremy Brooks  Procedure(s) Performed: Procedure(s): INCISION AND DRAINAGE ABSCESS NASAL SEPTAL HEMATOMA (Bilateral)  Patient Location: PACU  Anesthesia Type: General  Level of Consciousness: awake, alert  and patient cooperative  Airway and Oxygen Therapy: Patient Spontanous Breathing and Patient connected to supplemental oxygen  Post-op Assessment: Post-op Vital signs reviewed, Patient's Cardiovascular Status Stable, Respiratory Function Stable, Patent Airway and No signs of Nausea or vomiting  Post-op Vital Signs: Reviewed and stable  Complications: No apparent anesthesia complications

## 2015-10-13 ENCOUNTER — Encounter: Payer: Self-pay | Admitting: Unknown Physician Specialty

## 2015-10-14 LAB — SURGICAL PATHOLOGY

## 2015-10-15 LAB — ANAEROBIC CULTURE

## 2015-10-16 LAB — WOUND CULTURE

## 2016-04-26 IMAGING — CT CT MAXILLOFACIAL W/ CM
1 series · 15 of 30 positions shown, 19 images · IV contrast (omnipaque)
Comparison: None.

CLINICAL DATA: Swelling of the lower lip, 3 months duration.
Rhinorrhea. Possible nasal abscess.

EXAM:
CT MAXILLOFACIAL WITH CONTRAST
TECHNIQUE: Multidetector CT imaging of the maxillofacial structures was
performed with intravenous contrast. Multiplanar CT image
reconstructions were also generated. A small metallic BB was placed
on the right temple in order to reliably differentiate right from
left.
CONTRAST:  75mL OMNIPAQUE IOHEXOL 300 MG/ML  SOLN

[Series 2: max soft · axial · 0.36mm/px · z∈[-173,-15]mm · 15 of 85 slices shown, 19 images]
[im 3/85  brain]
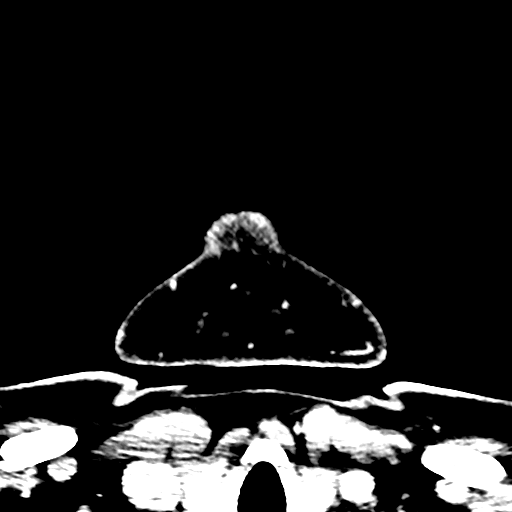
[im 3/85  bone]
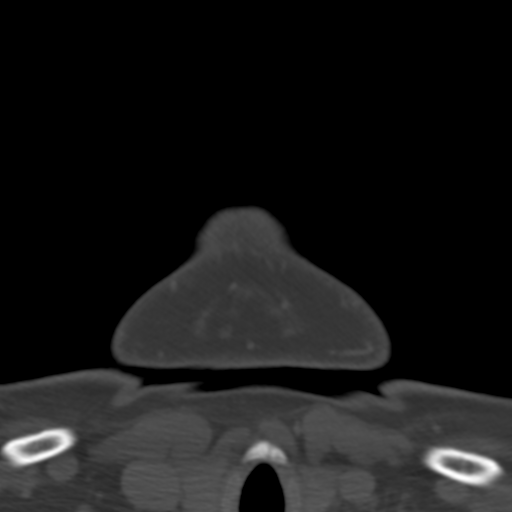
[im 9/85  bone]
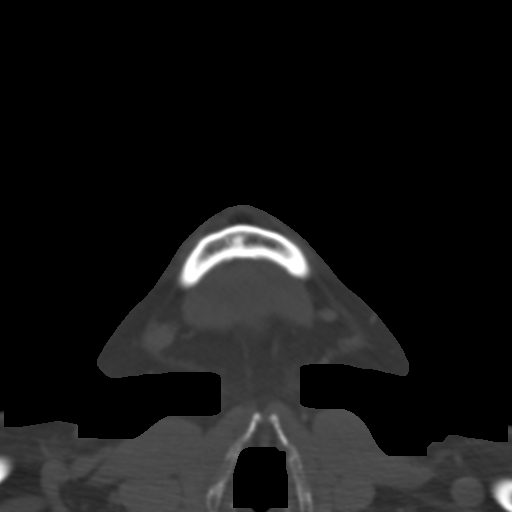
[im 15/85  bone]
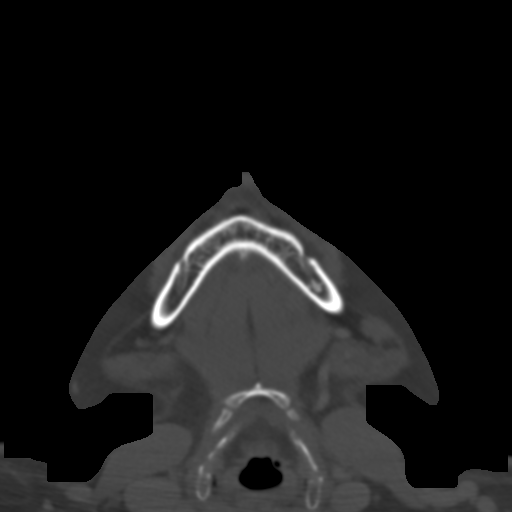
[im 21/85  bone]
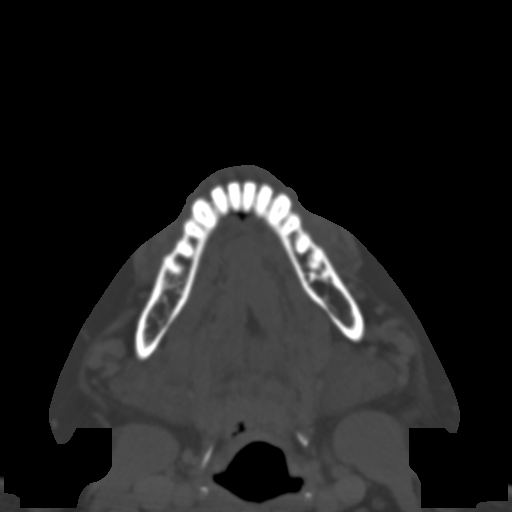
[im 27/85  brain]
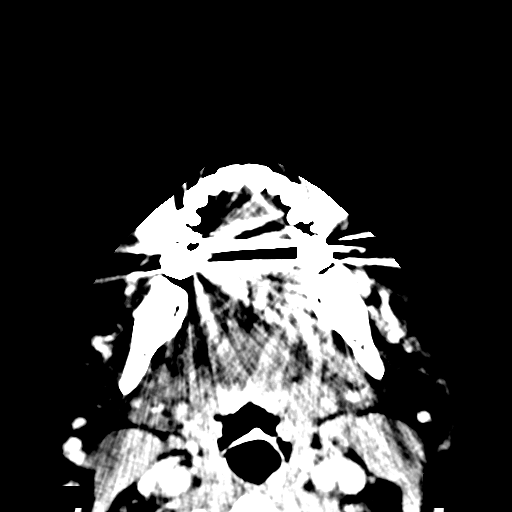
[im 27/85  bone]
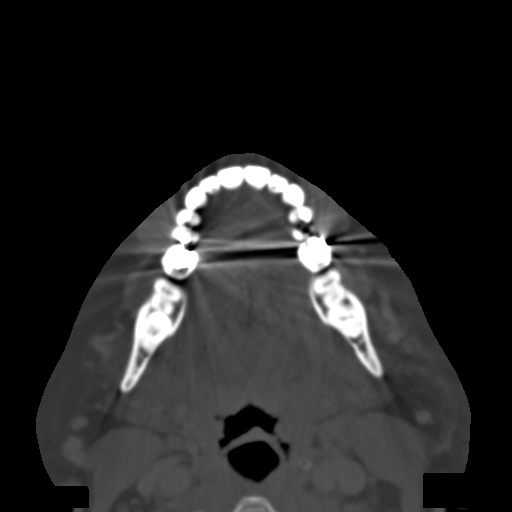
[im 32/85  bone]
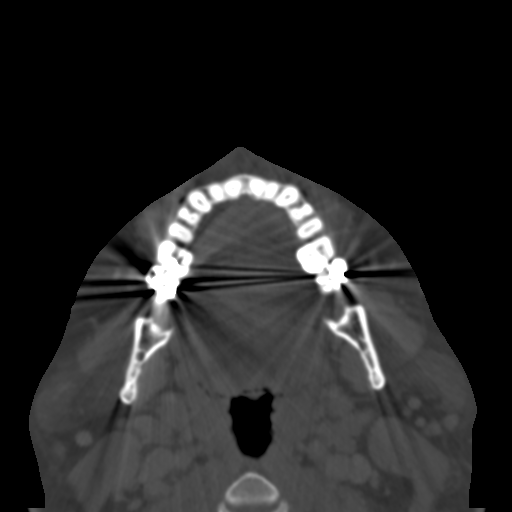
[im 38/85  bone]
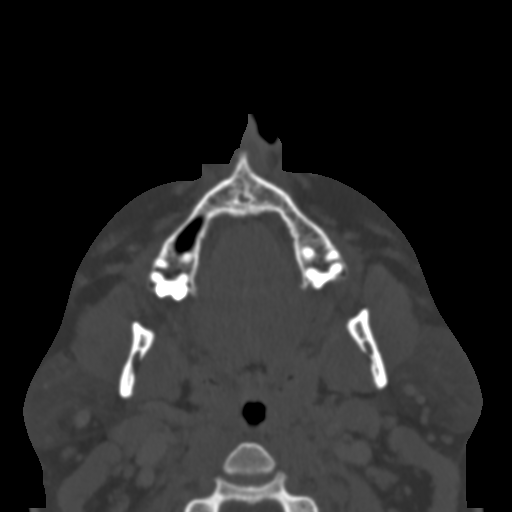
[im 44/85  bone]
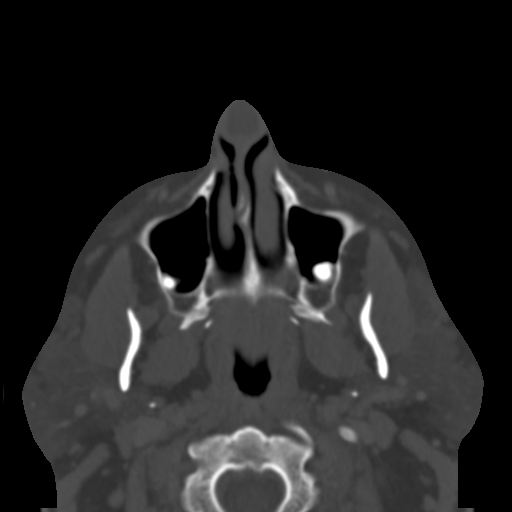
[im 47/85  brain]
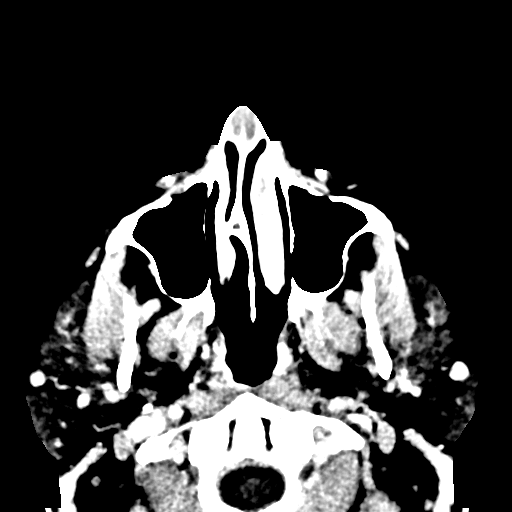
[im 47/85  bone]
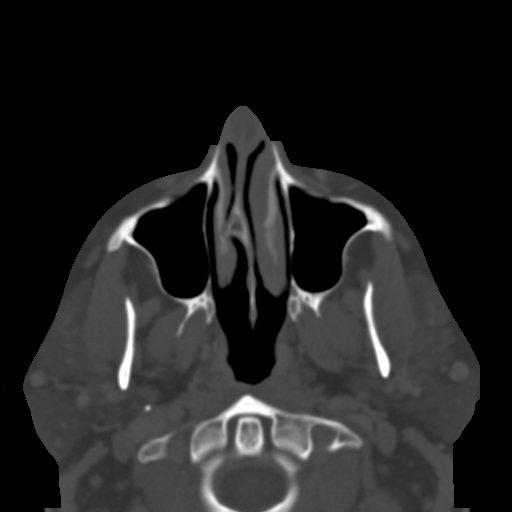
[im 53/85  bone]
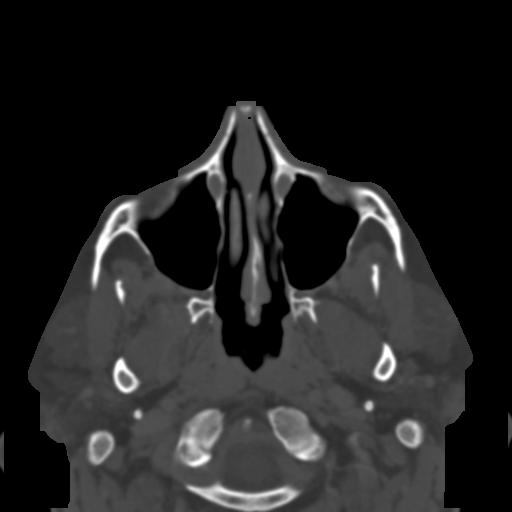
[im 58/85  bone]
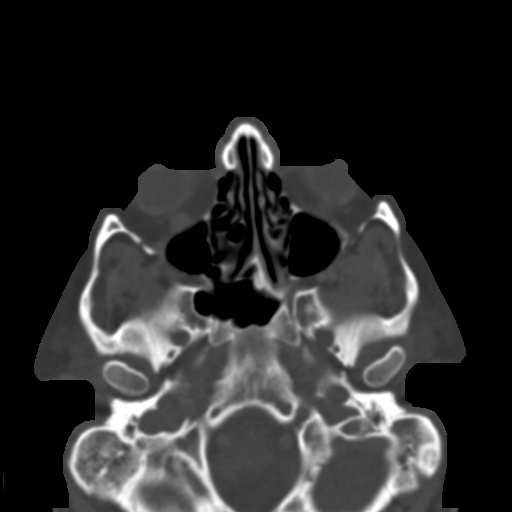
[im 64/85  bone]
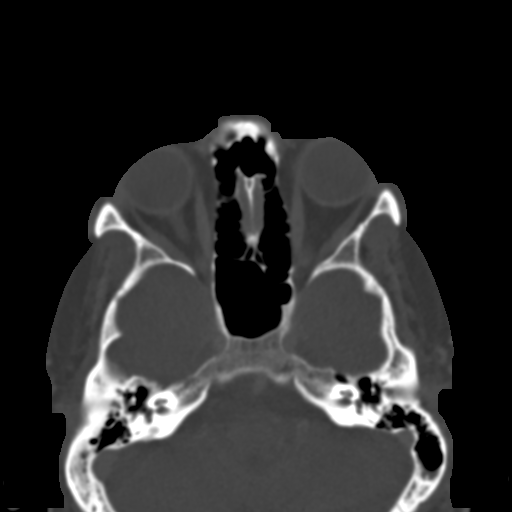
[im 70/85  brain]
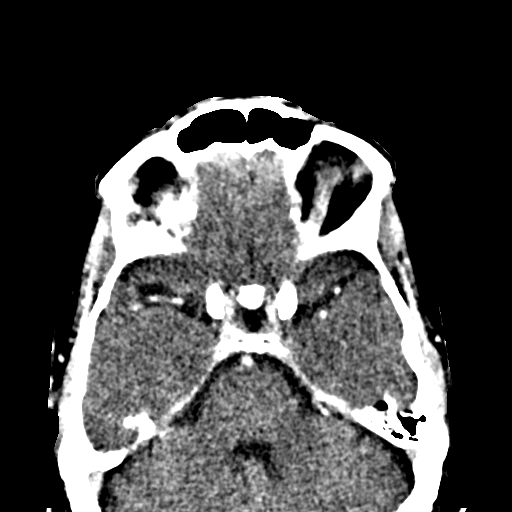
[im 70/85  bone]
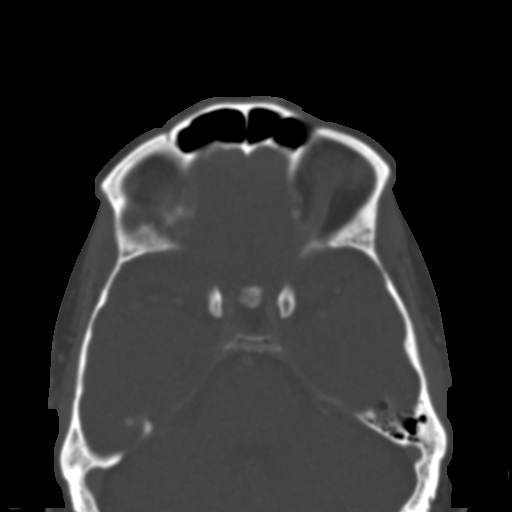
[im 76/85  bone]
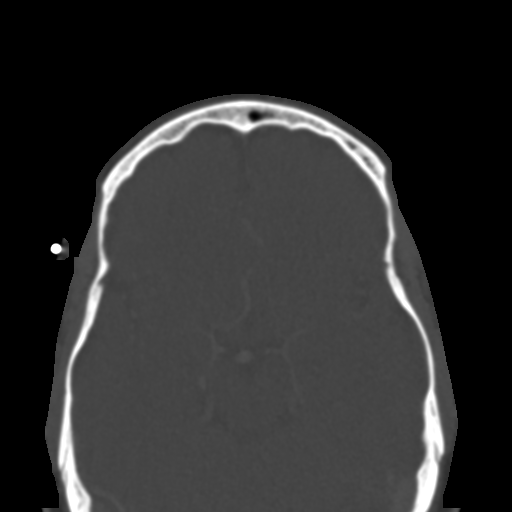
[im 82/85  bone]
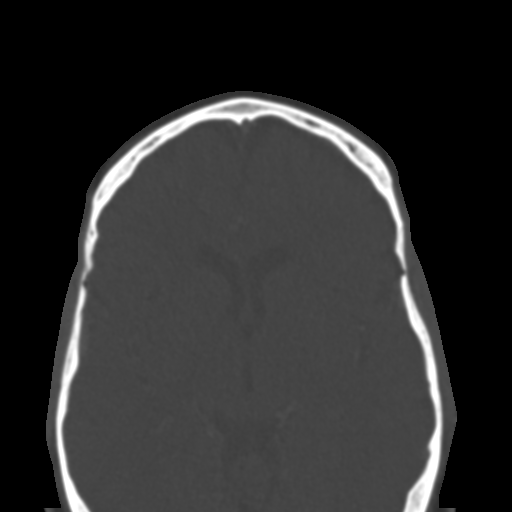

[15 of 30 positions shown; findings below may reference images not displayed]

FINDINGS: Lower lip does appear slightly prominent, but there is no specific
finding by CT. No evidence of radiopaque foreign object or
low-density collection.

There is abnormal low density within the cartilaginous portion of
the nasal septum anteriorly measuring 2.3 cm cephalocaudal with a
transverse diameter of 1.3 cm. Overlying mucosa appears grossly
intact. No destruction of the septum is seen. This could certainly
represent a nasal septal abscess. However, by imaging, this could
not be differentiated from unusual congenital lesions including
nasal glioma or a tumor of the teratoma/ dermoid/ epidermoid line.
Rarely, neoplastic tumors can arise in the nasal septum including
squamous cell carcinoma, neural tumors, neuroendocrine tumor and
sarcoma.

Paranasal sinuses are clear. Ostiomeatal complexes are normal
bilaterally. Wisdom teeth remain in place. No intracranial
abnormality seen. Bony nasal septum is intact. No evidence of skull
base lesion or deficiency.
IMPRESSION: Slight soft tissue prominence of the lower lip, nonspecific by CT.

Low-density abnormality in the anterior/ cartilaginous portion of
the nasal septum. This measures 2.3 x 1.3 x 1.3 cm. The appearance
is nonspecific by CT. This could certainly represent a nasal
abscess, but noninflammatory abnormalities including congenital and
acquired masses are possible.

## 2018-09-01 ENCOUNTER — Other Ambulatory Visit: Payer: Self-pay | Admitting: Family Medicine

## 2018-09-01 DIAGNOSIS — R740 Nonspecific elevation of levels of transaminase and lactic acid dehydrogenase [LDH]: Principal | ICD-10-CM

## 2018-09-01 DIAGNOSIS — R7401 Elevation of levels of liver transaminase levels: Secondary | ICD-10-CM

## 2018-09-08 ENCOUNTER — Ambulatory Visit: Payer: BLUE CROSS/BLUE SHIELD

## 2018-09-13 ENCOUNTER — Ambulatory Visit
Admission: RE | Admit: 2018-09-13 | Discharge: 2018-09-13 | Disposition: A | Discharge status: Home / Self Care | Payer: BLUE CROSS/BLUE SHIELD | Source: Ambulatory Visit | Attending: Family Medicine | Admitting: Family Medicine

## 2018-09-13 DIAGNOSIS — R7401 Elevation of levels of liver transaminase levels: Secondary | ICD-10-CM

## 2018-09-13 DIAGNOSIS — R740 Nonspecific elevation of levels of transaminase and lactic acid dehydrogenase [LDH]: Secondary | ICD-10-CM | POA: Diagnosis not present

## 2020-06-30 IMAGING — US ULTRASOUND ABDOMEN LIMITED
1 series · 14 of 25 positions shown · non-contrast
Comparison: No prior.

CLINICAL DATA: Transaminitis.

EXAM:
ULTRASOUND ABDOMEN LIMITED RIGHT UPPER QUADRANT

[Series 1: ultrasound abdomen limited · 0.23mm/px · 14 of 44 slices shown]
[im 1/44]
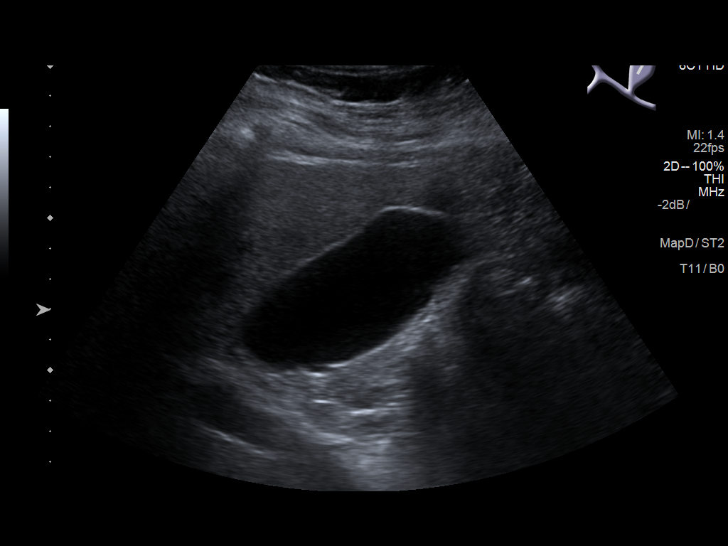
[im 4/44]
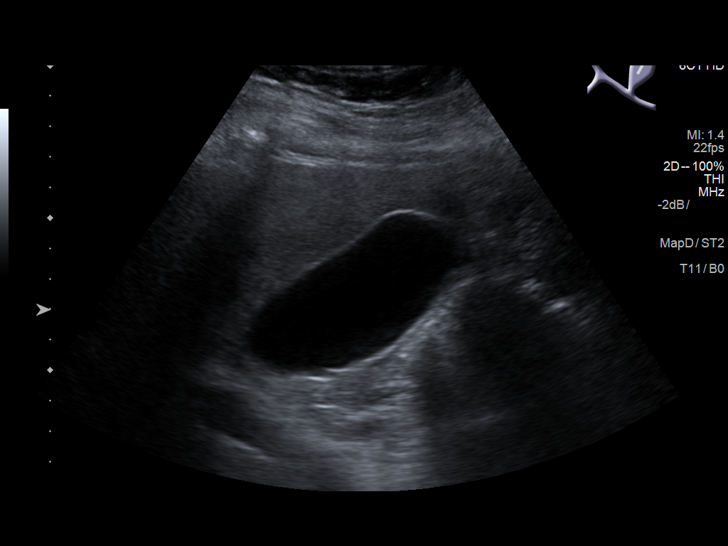
[im 8/44]
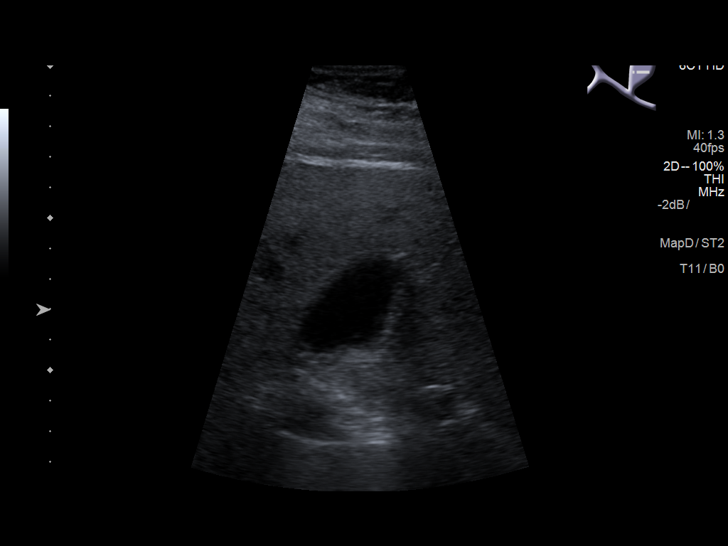
[im 11/44]
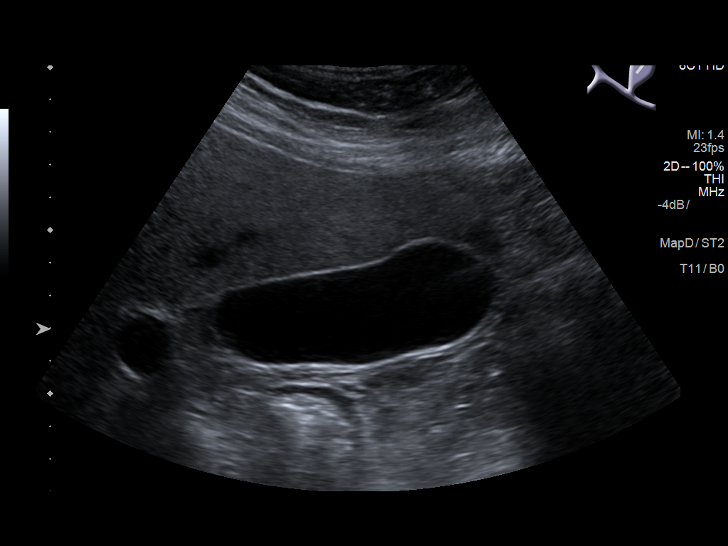
[im 15/44]
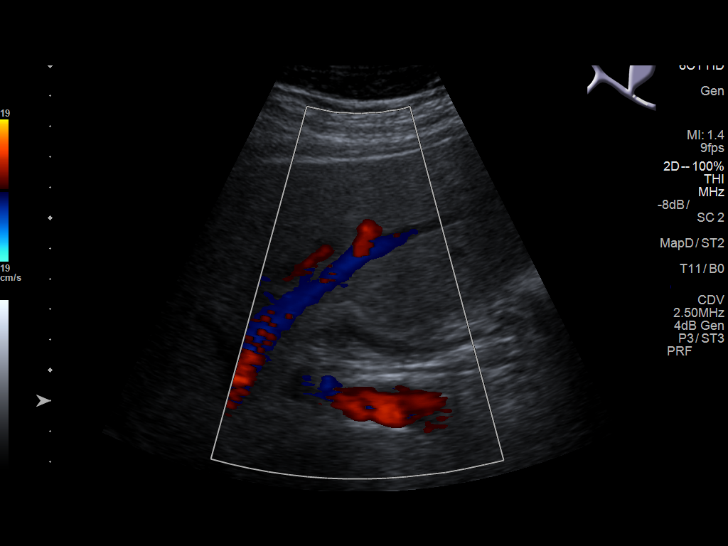
[im 17/44]
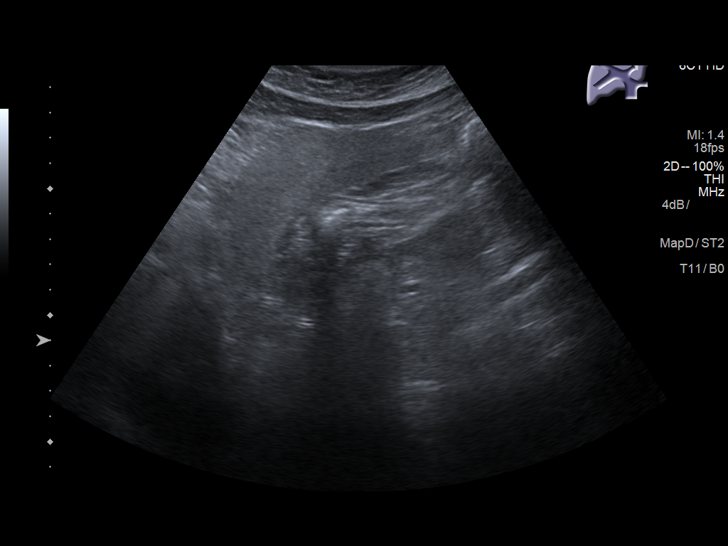
[im 20/44]
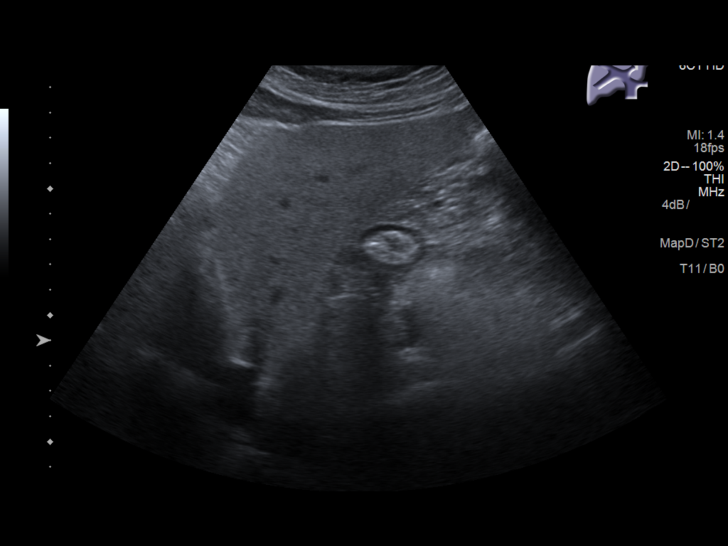
[im 24/44]
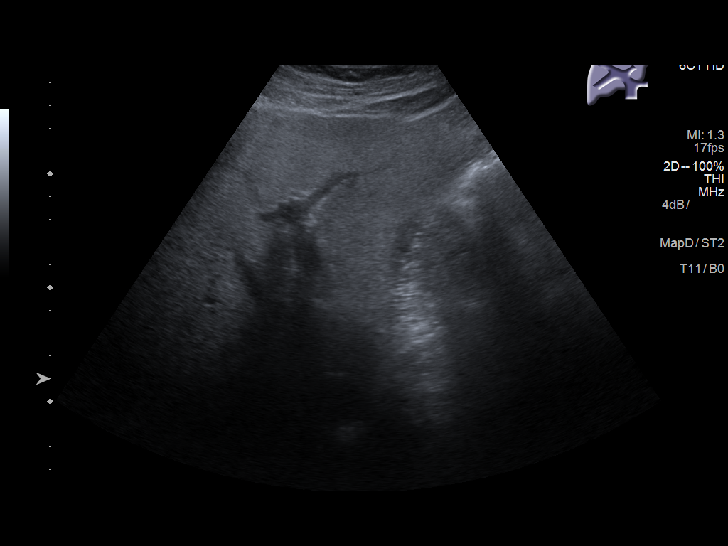
[im 27/44]
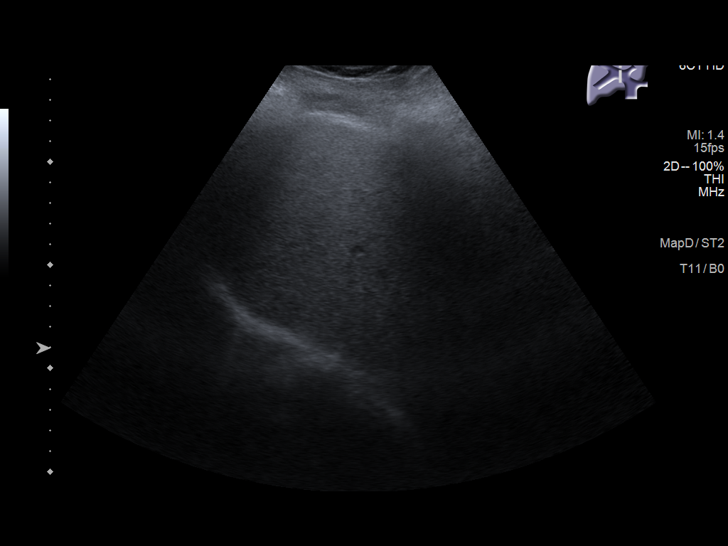
[im 29/44]
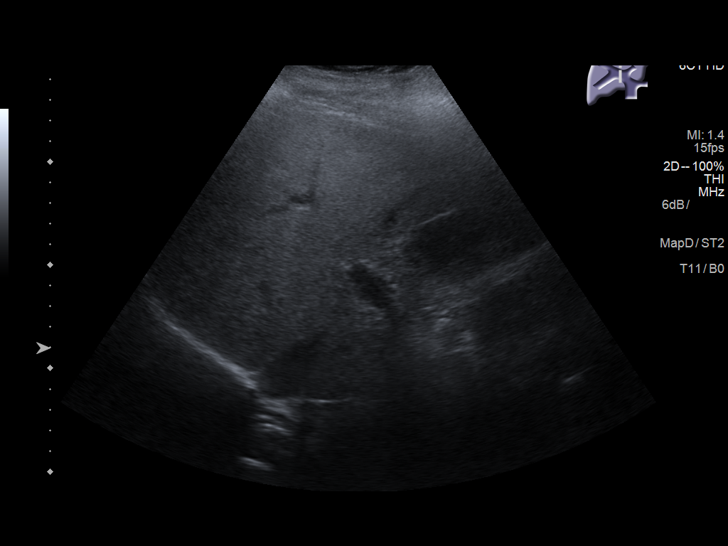
[im 33/44]
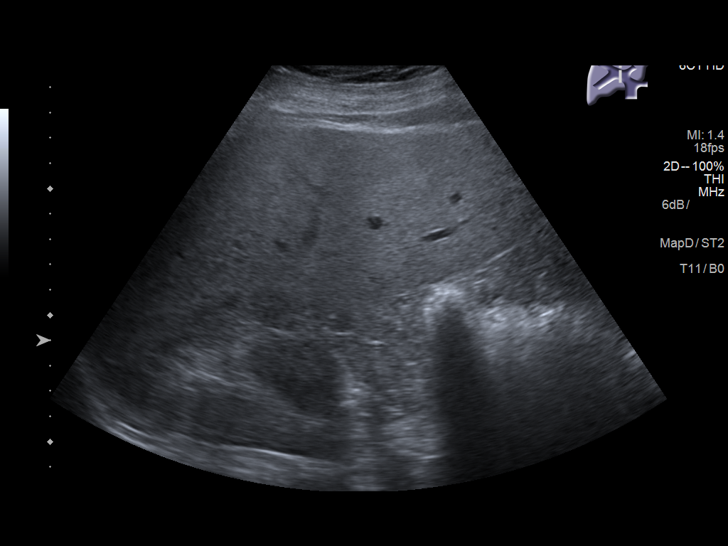
[im 36/44]
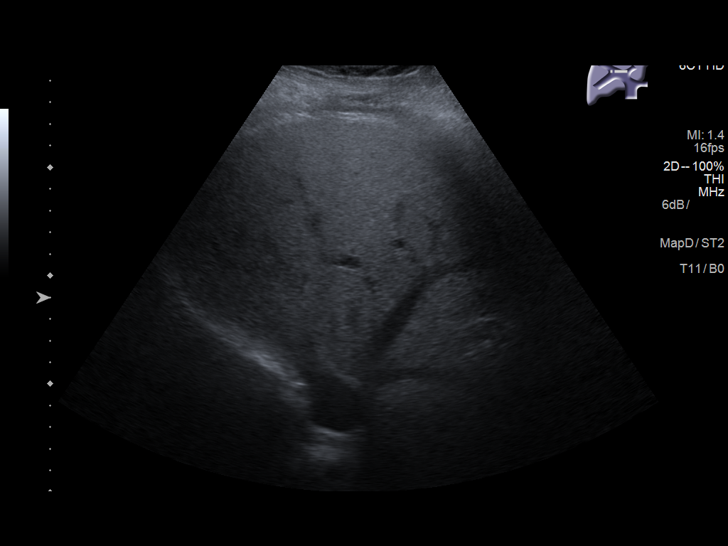
[im 40/44]
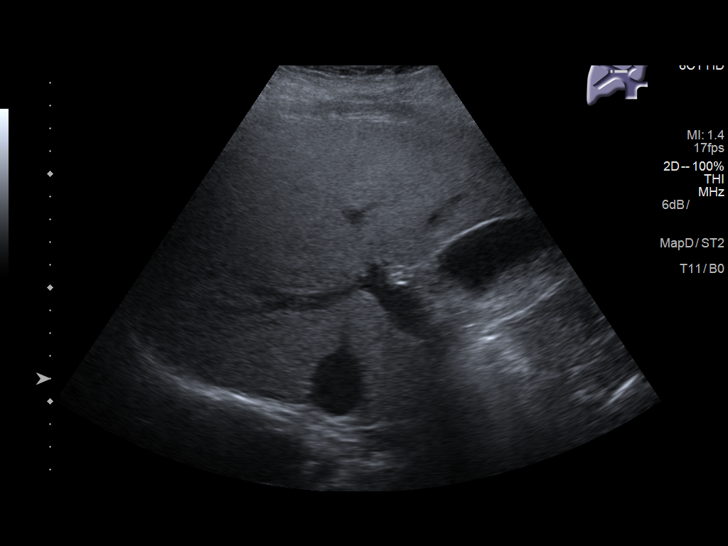
[im 44/44]
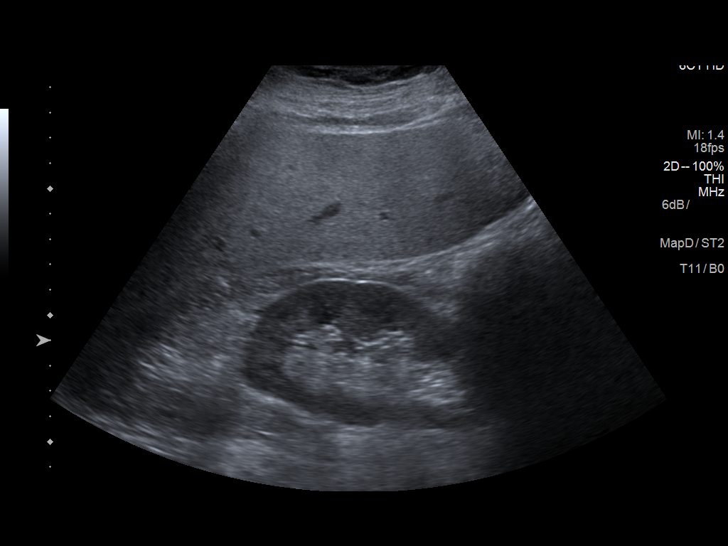

[14 of 25 positions shown; findings below may reference images not displayed]

FINDINGS: Gallbladder:

No gallstones or wall thickening visualized. No sonographic Murphy
sign noted by sonographer.

Common bile duct:

Diameter:

Liver:

Increased echogenicity consistent fatty infiltration and/or
hepatocellular disease. Portal vein is patent with normal direction
of flow.
IMPRESSION: Increased echogenicity liver consistent fatty infiltration or
hepatocellular disease.

2.  No gallstones or biliary distention.

## 2022-10-14 ENCOUNTER — Other Ambulatory Visit: Payer: Self-pay

## 2022-10-14 DIAGNOSIS — Z9189 Other specified personal risk factors, not elsewhere classified: Secondary | ICD-10-CM

## 2022-10-14 DIAGNOSIS — E1169 Type 2 diabetes mellitus with other specified complication: Secondary | ICD-10-CM

## 2022-11-08 ENCOUNTER — Ambulatory Visit
Admission: RE | Admit: 2022-11-08 | Discharge: 2022-11-08 | Disposition: A | Discharge status: Home / Self Care | Payer: Self-pay | Source: Ambulatory Visit | Attending: Family Medicine | Admitting: Family Medicine

## 2022-11-08 DIAGNOSIS — E785 Hyperlipidemia, unspecified: Secondary | ICD-10-CM | POA: Insufficient documentation

## 2022-11-08 DIAGNOSIS — Z9189 Other specified personal risk factors, not elsewhere classified: Secondary | ICD-10-CM | POA: Insufficient documentation

## 2022-11-08 DIAGNOSIS — E1169 Type 2 diabetes mellitus with other specified complication: Secondary | ICD-10-CM | POA: Insufficient documentation

## 2022-12-23 ENCOUNTER — Other Ambulatory Visit: Payer: Self-pay | Admitting: Internal Medicine

## 2022-12-23 DIAGNOSIS — I2510 Atherosclerotic heart disease of native coronary artery without angina pectoris: Secondary | ICD-10-CM

## 2022-12-23 DIAGNOSIS — R079 Chest pain, unspecified: Secondary | ICD-10-CM

## 2022-12-31 ENCOUNTER — Telehealth (HOSPITAL_COMMUNITY): Payer: Self-pay | Admitting: Emergency Medicine

## 2022-12-31 NOTE — Telephone Encounter (Signed)
Attempted to call patient regarding upcoming cardiac CT appointment. °Left message on voicemail with name and callback number °Lavene Penagos RN Navigator Cardiac Imaging °[facility] °[phone] Office °[phone] Cell ° °

## 2022-12-31 NOTE — Telephone Encounter (Signed)
Reaching out to patient to offer assistance regarding upcoming cardiac imaging study; pt verbalizes understanding of appt date/time, parking situation and where to check in, pre-test NPO status and medications ordered, and verified current allergies; name and call back number provided for further questions should they arise Dung Salinger RN Navigator Cardiac Imaging [facility] [phone] office [phone] cell 

## 2023-01-03 ENCOUNTER — Ambulatory Visit
Admission: RE | Admit: 2023-01-03 | Discharge: 2023-01-03 | Disposition: A | Discharge status: Home / Self Care | Payer: BC Managed Care – PPO | Source: Ambulatory Visit | Attending: Internal Medicine | Admitting: Internal Medicine

## 2023-01-03 DIAGNOSIS — R079 Chest pain, unspecified: Secondary | ICD-10-CM | POA: Insufficient documentation

## 2023-01-03 DIAGNOSIS — I2510 Atherosclerotic heart disease of native coronary artery without angina pectoris: Secondary | ICD-10-CM | POA: Diagnosis present

## 2023-01-03 LAB — POCT I-STAT CREATININE: Creatinine, Ser: 0.9 mg/dL (ref 0.61–1.24)

## 2023-01-03 MED ORDER — METOPROLOL TARTRATE 5 MG/5ML IV SOLN
INTRAVENOUS | Status: AC
  Filled 2023-01-03: qty 10

## 2023-01-03 MED ORDER — IOHEXOL 350 MG/ML SOLN
100.0000 mL | Freq: Once | INTRAVENOUS | Status: AC | PRN
  Administered 2023-01-03: 100 mL via INTRAVENOUS

## 2023-01-03 MED ORDER — METOPROLOL TARTRATE 5 MG/5ML IV SOLN
5.0000 mg | Freq: Once | INTRAVENOUS | Status: AC
  Administered 2023-01-03: 5 mg via INTRAVENOUS
  Filled 2023-01-03: qty 5

## 2023-01-03 MED ORDER — METOPROLOL TARTRATE 5 MG/5ML IV SOLN
10.0000 mg | Freq: Once | INTRAVENOUS | Status: AC
  Administered 2023-01-03: 10 mg via INTRAVENOUS
  Filled 2023-01-03: qty 10

## 2023-01-03 MED ORDER — NITROGLYCERIN 0.4 MG SL SUBL
0.8000 mg | SUBLINGUAL_TABLET | Freq: Once | SUBLINGUAL | Status: AC
  Administered 2023-01-03: 0.8 mg via SUBLINGUAL
  Filled 2023-01-03: qty 25

## 2023-01-03 NOTE — Progress Notes (Signed)
Patient tolerated procedure well. Ambulate w/o difficulty. Denies any lightheadedness or being dizzy. Pt denies any pain at this time. Sitting in chair, pt is encouraged to drink additional water throughout the day and reason explained to patient. Patient verbalized understanding and all questions answered. ABC intact. No further needs at this time. Discharge from procedure area w/o issues.  

## 2023-01-26 ENCOUNTER — Inpatient Hospital Stay: Admission: RE | Admit: 2023-01-26 | Payer: BC Managed Care – PPO | Source: Ambulatory Visit
